# Patient Record
Sex: Male | Born: 1980 | Race: White | Hispanic: No | Marital: Single | Smoking: Current every day smoker
Health system: Southern US, Community
[De-identification: ages and names within clinical notes are randomized; demographics above are authoritative.]

---

## 2011-11-20 ENCOUNTER — Encounter (HOSPITAL_COMMUNITY): Payer: Self-pay | Admitting: Emergency Medicine

## 2011-11-20 ENCOUNTER — Emergency Department (HOSPITAL_COMMUNITY)
Admission: EM | Admit: 2011-11-20 | Discharge: 2011-11-20 | Disposition: A | Discharge status: Home / Self Care | Payer: Self-pay | Attending: Emergency Medicine | Admitting: Emergency Medicine

## 2011-11-20 DIAGNOSIS — J020 Streptococcal pharyngitis: Secondary | ICD-10-CM | POA: Insufficient documentation

## 2011-11-20 DIAGNOSIS — R509 Fever, unspecified: Secondary | ICD-10-CM | POA: Insufficient documentation

## 2011-11-20 LAB — RAPID STREP SCREEN (MED CTR MEBANE ONLY): Streptococcus, Group A Screen (Direct): POSITIVE — AB

## 2011-11-20 MED ORDER — CLINDAMYCIN HCL 150 MG PO CAPS: ORAL_CAPSULE | ORAL | Status: AC

## 2011-11-20 NOTE — ED Notes (Signed)
Pt states sore throat since Sunday with fever yesterday. White exudate noted.

## 2011-11-20 NOTE — ED Provider Notes (Signed)
History     CSN: [id number]  Arrival date & time 11/20/11  1002   First MD Initiated Contact with Patient 11/20/11 1015      Chief Complaint  Patient presents with  . Sore Throat    HPI Pt was seen at 1025.  Per pt, c/o gradual onset and persistence of constant sore throat for the past 3 days.  Has been associated with home fevers to "102."  Denies SOB/cough, no sneezing, no sinus congestion, no rash.     History reviewed. No pertinent past medical history.  History reviewed. No pertinent past surgical history.    History  Substance Use Topics  . Smoking status: Current Everyday Smoker    Types: Cigarettes  . Smokeless tobacco: Not on file  . Alcohol Use: No    Review of Systems ROS: Statement: All systems negative except as marked or noted in the HPI; Constitutional: Positive for fever and chills. ; ; Eyes: Negative for eye pain, redness and discharge. ; ; ENMT: Negative for ear pain, hoarseness, nasal congestion, sinus pressure and +sore throat. ; ; Cardiovascular: Negative for chest pain, palpitations, diaphoresis, dyspnea and peripheral edema. ; ; Respiratory: Negative for cough, wheezing and stridor. ; ; Gastrointestinal: Negative for nausea, vomiting, diarrhea, abdominal pain, blood in stool, hematemesis, jaundice and rectal bleeding. . ; ; Genitourinary: Negative for dysuria, flank pain and hematuria. ; ; Musculoskeletal: Negative for back pain and neck pain. Negative for swelling and trauma.; ; Skin: Negative for pruritus, rash, abrasions, blisters, bruising and skin lesion.; ; Neuro: Negative for headache, lightheadedness and neck stiffness. Negative for weakness, altered level of consciousness , altered mental status, extremity weakness, paresthesias, involuntary movement, seizure and syncope.      Allergies  Penicillins  Home Medications  No current outpatient prescriptions on file.  BP 129/81  Pulse 85  Temp(Src) 99 F (37.2 C) (Oral)  Resp 17  Ht 5\' 11"   (1.803 m)  Wt 205 lb (92.987 kg)  BMI 28.59 kg/m2  SpO2 94%  Physical Exam 1030: Physical examination:  Nursing notes reviewed; Vital signs and O2 SAT reviewed;  Constitutional: Well developed, Well nourished, Well hydrated, In no acute distress; Head:  Normocephalic, atraumatic; Eyes: EOMI, PERRL, No scleral icterus; ENMT: TM's clear bilat, +post pharynx erythemetous with bilat tonsilar exudates, no soft-palate bulging, no intra-oral edema, no hoarse voice, no drooling, no stridor.  Mouth normal, Mucous membranes moist; Neck: Supple, Full range of motion, No lymphadenopathy; Cardiovascular: Regular rate and rhythm, No murmur, rub, or gallop; Respiratory: Breath sounds clear & equal bilaterally, No rales, rhonchi, wheezes, or rub, Speaking full sentences with ease.  Normal respiratory effort/excursion; Chest: Nontender, Movement normal;; Extremities: Pulses normal, No tenderness, No edema, No calf edema or asymmetry.; Neuro: AA&Ox3, Major CN grossly intact.  No gross focal motor or sensory deficits in extremities.; Skin: Color normal, Warm, Dry, no rash.    ED Course  Procedures  MDM  MDM Reviewed: nursing note and vitals Interpretation: labs     Results for orders placed during the hospital encounter of 11/20/11  RAPID STREP SCREEN      Component Value Range   Streptococcus, Group A Screen (Direct) POSITIVE (*) NEGATIVE      11:27 AM:  Allergic to PCN, will tx with clindamycin.  Dx testing d/w pt.  Questions answered.  Verb understanding, agreeable to d/c home with outpt f/u.         No midlevel was involved in the care of this patient. [facility] M  Laray Anger, DO 11/22/11 2217

## 2011-11-20 NOTE — ED Notes (Signed)
Pt c/o sore throat since Sunday.  

## 2011-11-20 NOTE — Discharge Instructions (Signed)
RESOURCE GUIDE  Dental Problems  Patients with Medicaid: [facility]                     [facility] [address]                                           [address] Phone:  [phone]                                                  Phone:  [phone]  If unable to pay or uninsured, contact:  [facility] or [facility] to become qualified for the adult dental clinic.  Chronic Pain Problems Contact [facility]  [phone] Patients need to be referred by their primary care doctor.  Insufficient Money for Medicine Contact [employer]:  call "211" or [facility] 271-5999.  No Primary Care Doctor Call [facility]  [phone] Other agencies that provide inexpensive medical care    [facility]  [phone]    [facility]  [phone]    [facility]  [phone]    Women[facility] Clinic  [phone]    [facility]  [phone]    [facility]  [phone]  Psychological Services [facility]  [phone] [facility]  [phone] [facility]   [phone] (emergency services [phone])  Substance Abuse Resources [facility]  336-882-2125 [facility] 336-784-9470 [facility] 336-285-9073 [facility] 336-845-3988 Residential & Outpatient Substance Abuse Program  800-659-3381  Abuse/Neglect [place] Child Abuse Hotline (336) 641-3795 [place] Child Abuse Hotline 800-378-5315 (After Hours)  Emergency Shelter [facility] (336) 271-5985  Maternity Homes [facility] (336) 275-9566 [facility] (704) 372-4663  MRSA Hotline #:   832-7006    [place] [facility]  [facility] [place] [facility]     [employer]                          [facility] [address] [place]                       [address]      [address]  [place]                                                [place]                            [place] Phone:  [phone]                                   Phone:  [phone]                 Phone:  [phone]  [facility] Phone:  [phone]    [facility] Child Abuse Hotline 7051655903 954-721-9316 (After Hours)    Take over the counter tylenol and ibuprofen, as directed on packaging, as needed for discomfort.  Gargle with warm water several times per day to help with discomfort.  May also use over the counter sore throat pain medicines such as chloraseptic or sucrets, as directed on packaging, as needed for discomfort.  Call your regular medical doctor today to schedule a follow up appointment within the next week.  Return to the Emergency Department immediately if worsening.

## 2016-12-10 ENCOUNTER — Emergency Department (HOSPITAL_COMMUNITY)
Admission: EM | Admit: 2016-12-10 | Discharge: 2016-12-10 | Disposition: A | Discharge status: Home / Self Care | Payer: Self-pay | Attending: Emergency Medicine | Admitting: Emergency Medicine

## 2016-12-10 ENCOUNTER — Encounter (HOSPITAL_COMMUNITY): Payer: Self-pay | Admitting: Emergency Medicine

## 2016-12-10 ENCOUNTER — Emergency Department (HOSPITAL_COMMUNITY): Payer: Self-pay

## 2016-12-10 ENCOUNTER — Other Ambulatory Visit: Payer: Self-pay

## 2016-12-10 DIAGNOSIS — R079 Chest pain, unspecified: Secondary | ICD-10-CM

## 2016-12-10 DIAGNOSIS — F17210 Nicotine dependence, cigarettes, uncomplicated: Secondary | ICD-10-CM | POA: Insufficient documentation

## 2016-12-10 DIAGNOSIS — R091 Pleurisy: Secondary | ICD-10-CM | POA: Insufficient documentation

## 2016-12-10 LAB — COMPREHENSIVE METABOLIC PANEL
ALK PHOS: 76 U/L (ref 38–126)
ALT: 35 U/L (ref 17–63)
AST: 26 U/L (ref 15–41)
Albumin: 4 g/dL (ref 3.5–5.0)
Anion gap: 7 (ref 5–15)
BUN: 10 mg/dL (ref 6–20)
CALCIUM: 9.3 mg/dL (ref 8.9–10.3)
CHLORIDE: 103 mmol/L (ref 101–111)
CO2: 28 mmol/L (ref 22–32)
CREATININE: 1.13 mg/dL (ref 0.61–1.24)
Glucose, Bld: 156 mg/dL — ABNORMAL HIGH (ref 65–99)
Potassium: 4.2 mmol/L (ref 3.5–5.1)
Sodium: 138 mmol/L (ref 135–145)
Total Bilirubin: 0.5 mg/dL (ref 0.3–1.2)
Total Protein: 7.6 g/dL (ref 6.5–8.1)

## 2016-12-10 LAB — CBC WITH DIFFERENTIAL/PLATELET
Basophils Absolute: 0 10*3/uL (ref 0.0–0.1)
Basophils Relative: 0 %
Eosinophils Absolute: 0.3 10*3/uL (ref 0.0–0.7)
Eosinophils Relative: 4 %
HCT: 51.3 % (ref 39.0–52.0)
Hemoglobin: 17.3 g/dL — ABNORMAL HIGH (ref 13.0–17.0)
Lymphocytes Relative: 26 %
Lymphs Abs: 2 10*3/uL (ref 0.7–4.0)
MCH: 30.6 pg (ref 26.0–34.0)
MCHC: 33.7 g/dL (ref 30.0–36.0)
MCV: 90.8 fL (ref 78.0–100.0)
Monocytes Absolute: 0.5 10*3/uL (ref 0.1–1.0)
Monocytes Relative: 6 %
Neutro Abs: 5 10*3/uL (ref 1.7–7.7)
Neutrophils Relative %: 64 %
Platelets: 241 10*3/uL (ref 150–400)
RBC: 5.65 MIL/uL (ref 4.22–5.81)
RDW: 12.2 % (ref 11.5–15.5)
WBC: 7.8 10*3/uL (ref 4.0–10.5)

## 2016-12-10 LAB — TROPONIN I: Troponin I: <0.03 ng/mL

## 2016-12-10 MED ORDER — IOPAMIDOL (ISOVUE-300) INJECTION 61%: 100.0000 mL | Freq: Once | INTRAVENOUS | Status: DC | PRN

## 2016-12-10 MED ORDER — KETOROLAC TROMETHAMINE 30 MG/ML IJ SOLN
30.0000 mg | Freq: Once | INTRAMUSCULAR | Status: AC
  Administered 2016-12-10: 30 mg via INTRAVENOUS
  Filled 2016-12-10: qty 1

## 2016-12-10 MED ORDER — IBUPROFEN 800 MG PO TABS: 800.0000 mg | ORAL_TABLET | Freq: Three times a day (TID) | ORAL | 0 refills | Status: AC

## 2016-12-10 MED ORDER — HYDROCODONE-ACETAMINOPHEN 5-325 MG PO TABS
2.0000 | ORAL_TABLET | Freq: Once | ORAL | Status: AC
  Administered 2016-12-10: 2 via ORAL
  Filled 2016-12-10: qty 2

## 2016-12-10 MED ORDER — IOPAMIDOL (ISOVUE-370) INJECTION 76%
100.0000 mL | Freq: Once | INTRAVENOUS | Status: AC | PRN
  Administered 2016-12-10: 100 mL via INTRAVENOUS

## 2016-12-10 NOTE — ED Notes (Signed)
Patient transported to X-ray 

## 2016-12-10 NOTE — ED Provider Notes (Signed)
[facility] DEPT Provider Note   CSN: [id number] Arrival date & time: 12/10/16  1743     History   Chief Complaint Chief Complaint  Patient presents with  . Chest Pain    HPI Anthony Zuniga is a 36 y.o. male.  HPI  The patient is a 36 year old male with no significant past medical history other than hypertension for which he no longer takes medications.  He states that he had a recent physical exam a couple of weeks ago when his blood pressure was 130/80, this was after not taking medications for some time. He was in his usual state of health until 4 days ago when he developed left-sided chest and left upper back pain. This is sharp and stabbing, severe, seems to be worse with breathing.  He denies any swelling of his legs, denies any recent travel, trauma, immobilization, surgery. He denies any prior history of DVT or pulmonary embolism. He also denies any history of chest pain, cardiac disease or prior lung disease. He does smoke cigarettes. This pain does seem to get worse with deep breathing as well as movement and change of position but does not get worse with palpation over the chest wall.  History reviewed. No pertinent past medical history.  There are no active problems to display for this patient.   History reviewed. No pertinent surgical history.     Home Medications    Prior to Admission medications   Medication Sig Start Date End Date Taking? Authorizing Provider  ibuprofen (ADVIL,MOTRIN) 800 MG tablet Take 1 tablet (800 mg total) by mouth 3 (three) times daily. 12/10/16   Eber Hong, MD    Family History History reviewed. No pertinent family history.  Social History Social History  Substance Use Topics  . Smoking status: Current Every Day Smoker    Packs/day: 1.00    Types: Cigarettes  . Smokeless tobacco: Never Used  . Alcohol use No     Allergies   Penicillins   Review of Systems Review of Systems  All other systems reviewed and are  negative.    Physical Exam Updated Vital Signs BP (!) 168/97   Pulse (!) 104   Temp 98.4 F (36.9 C) (Oral)   Resp (!) 23   Wt 220 lb (99.8 kg)   SpO2 100%   BMI 30.68 kg/m   Physical Exam  Constitutional: He appears well-developed and well-nourished. No distress.  Uncomfortable appearing  HENT:  Head: Normocephalic and atraumatic.  Mouth/Throat: Oropharynx is clear and moist. No oropharyngeal exudate.  Eyes: Conjunctivae and EOM are normal. Pupils are equal, round, and reactive to light. Right eye exhibits no discharge. Left eye exhibits no discharge. No scleral icterus.  Neck: Normal range of motion. Neck supple. No JVD present. No thyromegaly present.  Cardiovascular: Regular rhythm, normal heart sounds and intact distal pulses.  Exam reveals no gallop and no friction rub.   No murmur heard. Mild tachycardia  Pulmonary/Chest: Effort normal. No respiratory distress. He has wheezes. He has no rales.  Abdominal: Soft. Bowel sounds are normal. He exhibits no distension and no mass. There is no tenderness.  Musculoskeletal: Normal range of motion. He exhibits no edema or tenderness.  No peripheral edema  Lymphadenopathy:    He has no cervical adenopathy.  Neurological: He is alert. Coordination normal.  Skin: Skin is warm and dry. No rash noted. No erythema.  Psychiatric: He has a normal mood and affect. His behavior is normal.  Nursing note and vitals reviewed.  ED Treatments / Results  Labs (all labs ordered are listed, but only abnormal results are displayed) Labs Reviewed  CBC WITH DIFFERENTIAL/PLATELET - Abnormal; Notable for the following:       Result Value   Hemoglobin 17.3 (*)    All other components within normal limits  COMPREHENSIVE METABOLIC PANEL - Abnormal; Notable for the following:    Glucose, Bld 156 (*)    All other components within normal limits  TROPONIN I   ED ECG REPORT  I personally interpreted this EKG   Date: 12/10/2016   Rate: 100   Rhythm: sinus tachycardia  QRS Axis: right  Intervals: normal  ST/T Wave abnormalities: nonspecific ST/T changes  Conduction Disutrbances:none  Narrative Interpretation:   Old EKG Reviewed: none available   Radiology Dg Chest 2 View  Result Date: 12/10/2016 CLINICAL DATA:  Chest pain for several days EXAM: CHEST  2 VIEW COMPARISON:  None. FINDINGS: The heart size and mediastinal contours are within normal limits. Both lungs are clear. The visualized skeletal structures are unremarkable. IMPRESSION: No active cardiopulmonary disease. Electronically Signed   By: Jasmine Pang M.D.   On: 12/10/2016 18:39   Ct Angio Chest Pe W Or Wo Contrast  Result Date: 12/10/2016 CLINICAL DATA:  Chest pain starting 3-4 days ago EXAM: CT ANGIOGRAPHY CHEST WITH CONTRAST TECHNIQUE: Multidetector CT imaging of the chest was performed using the standard protocol during bolus administration of intravenous contrast. Multiplanar CT image reconstructions and MIPs were obtained to evaluate the vascular anatomy. CONTRAST:  100 cc Isovue 370 IV COMPARISON:  CXR 12/10/2016 FINDINGS: Cardiovascular: The study is of quality for the evaluation of pulmonary embolism. There are no filling defects in the central, lobar, segmental or subsegmental pulmonary artery branches to suggest acute pulmonary embolism. Great vessels are normal in course and caliber. Normal heart size. No significant pericardial fluid/thickening. No thoracic aortic aneurysm nor dissection. Mediastinum/Nodes: No discrete thyroid nodules. Unremarkable esophagus. No pathologically enlarged axillary, mediastinal or hilar lymph nodes. Lungs/Pleura: No pneumothorax. No pleural effusion. No dominant mass or pulmonary consolidations. Ground-glass opacities along the dependent aspect of both lower lobes likely represent changes of atelectasis. Upper abdomen: A 4 mm hypodensity in the right hepatic dome, series 4, image 71 is too small to further characterize but more  commonly is associated with a cyst or hemangioma. Musculoskeletal:  No aggressive appearing focal osseous lesions. Review of the MIP images confirms the above findings. IMPRESSION: 1. No acute pulmonary embolism, aortic aneurysm or dissection. 2. No acute pulmonary abnormality.  Dependent atelectasis noted. 3. Tiny 4 mm hypodensity in the right hepatic dome more commonly associated with a cyst or hemangioma but is too small to further characterize. Electronically Signed   By: Tollie Eth M.D.   On: 12/10/2016 19:23    Procedures Procedures (including critical care time)  Medications Ordered in ED Medications  iopamidol (ISOVUE-300) 61 % injection 100 mL (not administered)  HYDROcodone-acetaminophen (NORCO/VICODIN) 5-325 MG per tablet 2 tablet (not administered)  ketorolac (TORADOL) 30 MG/ML injection 30 mg (30 mg Intravenous Given 12/10/16 1818)  iopamidol (ISOVUE-370) 76 % injection 100 mL (100 mLs Intravenous Contrast Given 12/10/16 1901)     Initial Impression / Assessment and Plan / ED Course  I have reviewed the triage vital signs and the nursing notes.  Pertinent labs & imaging results that were available during my care of the patient were reviewed by me and considered in my medical decision making (see chart for details).   The patient has a  markedly abnormal EKG with right axis deviation, sinus tachycardia and some nonspecific ST and T waves. By history this appears to be more of a pleuritic type pain suggestive of possible underlying pulmonary embolism, lung pathology and less likely to be acutely cardiac. We'll obtain labs including a troponin, chest x-ray, the patient will likely need a CT angiogram of no alternative etiology is found. While there is a slight wheeze on exam it is not surprising in this patient was a chronic smoker.  Hydrocodone and toradol given Labs and CT results reviewed - no PE or other serious pathology Pt informed of all results, given copy of results. Can  f/u outpatient Pt agreeable.  Final Clinical Impressions(s) / ED Diagnoses   Final diagnoses:  Chest pain  Pleurisy    New Prescriptions New Prescriptions   IBUPROFEN (ADVIL,MOTRIN) 800 MG TABLET    Take 1 tablet (800 mg total) by mouth 3 (three) times daily.     Eber HongMiller, Jackolyn Geron, MD 12/10/16 [phone]

## 2016-12-10 NOTE — ED Triage Notes (Signed)
CP started 3-4 days ago with radiation to back, describes as a stabbing pain worse with inhalation. Cannot replicate pain with palpation, denies injury.

## 2016-12-10 NOTE — Discharge Instructions (Signed)
Your testing shows no signs of blood clot or lung or heart abnormality -you likely have pleurisy - see the attachments for further information on this - it should get better over next 10 days Motrin 3 times a day Avoid exertion / deep breathing ER if you have worsening symptoms.

## 2018-04-06 IMAGING — CT CT ANGIO CHEST
2 of 6 series · 18 of 46 positions shown · IV contrast (Isovue)
Comparison: CXR 12/10/2016

CLINICAL DATA: Chest pain starting 3-4 days ago

EXAM:
CT ANGIOGRAPHY CHEST WITH CONTRAST
TECHNIQUE: Multidetector CT imaging of the chest was performed using the
standard protocol during bolus administration of intravenous
contrast. Multiplanar CT image reconstructions and MIPs were
obtained to evaluate the vascular anatomy.
CONTRAST:  100 cc Isovue 370 IV

[Series 5: thins · axial · 0.76mm/px · z∈[+1269,+1538]mm · 15 of 295 slices shown]
[im 13/295  lung]
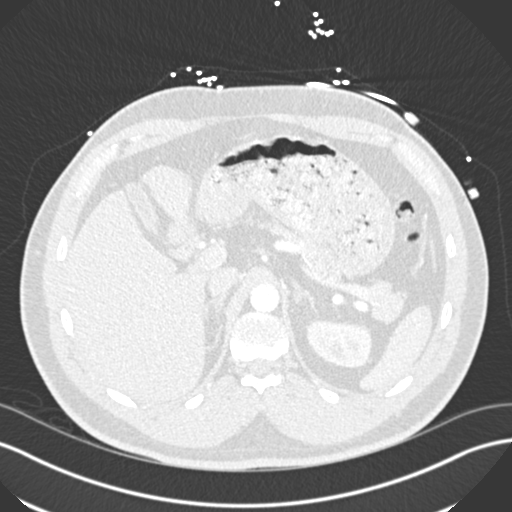
[im 39/295  soft-tissue]
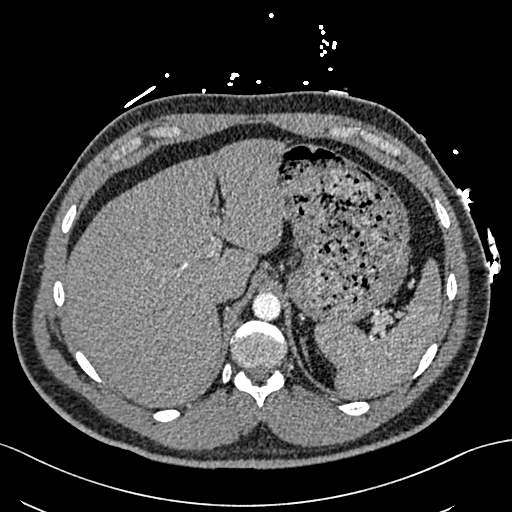
[im 52/295  lung]
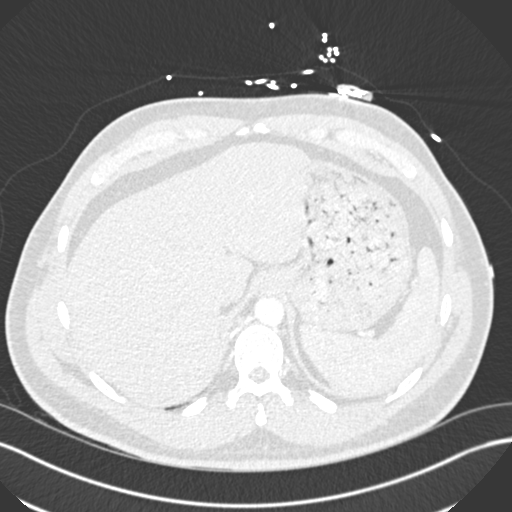
[im 77/295  soft-tissue]
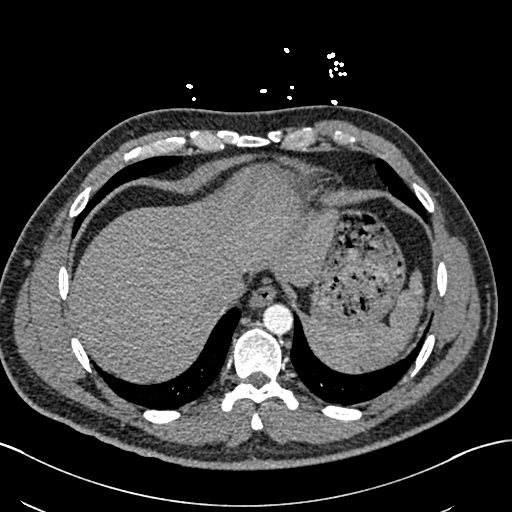
[im 90/295  lung]
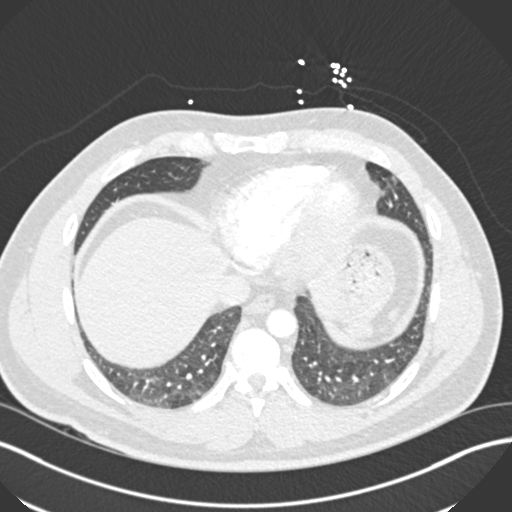
[im 116/295  soft-tissue]
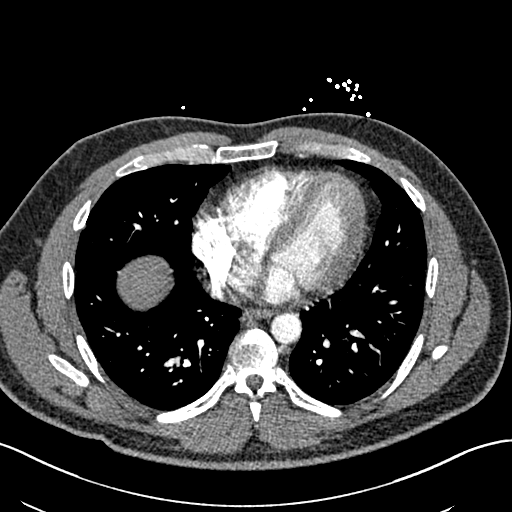
[im 128/295  lung]
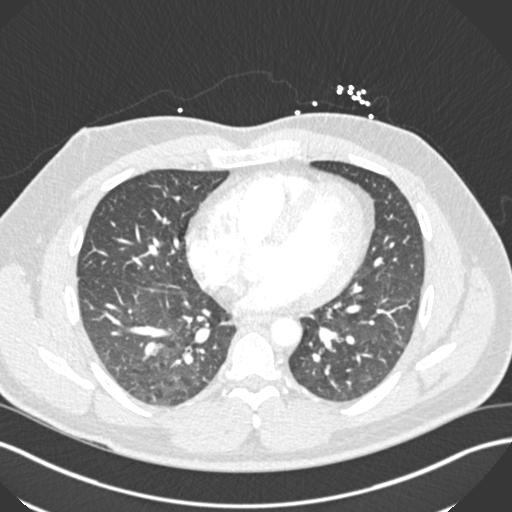
[im 154/295  soft-tissue]
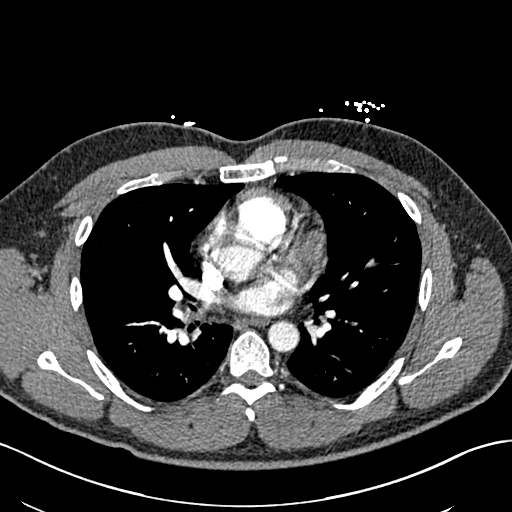
[im 167/295  lung]
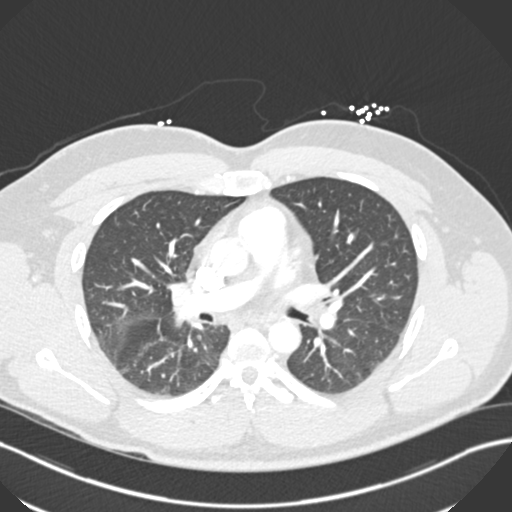
[im 179/295  soft-tissue]
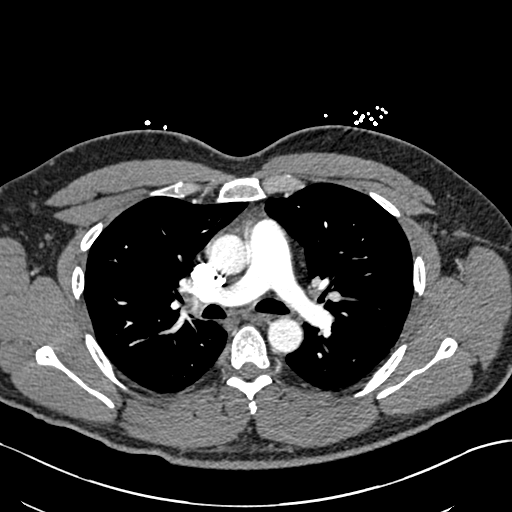
[im 205/295  lung]
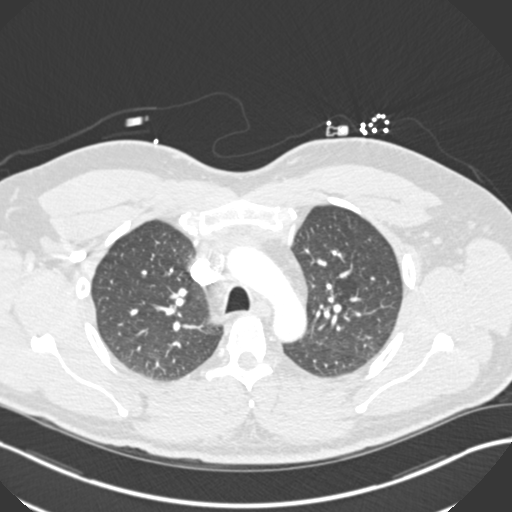
[im 218/295  soft-tissue]
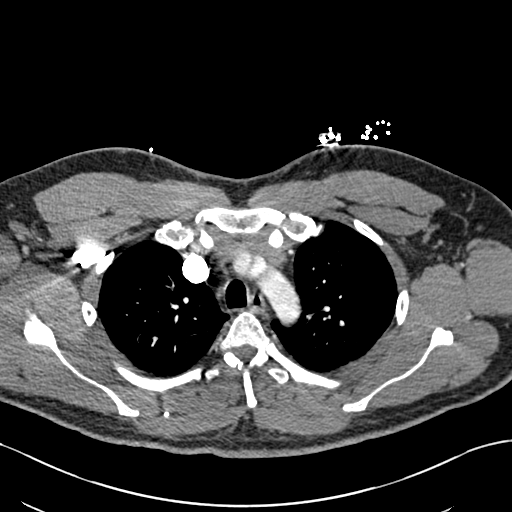
[im 243/295  lung]
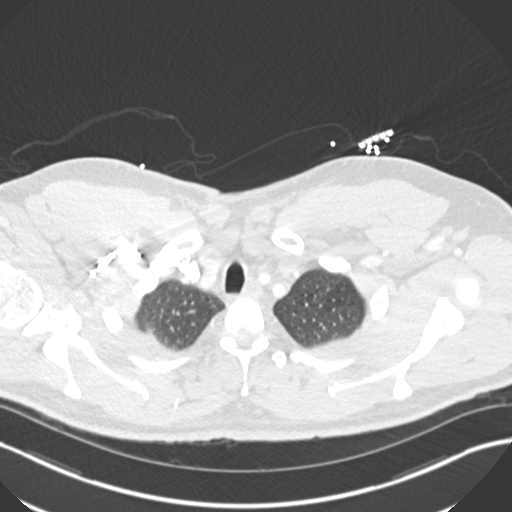
[im 256/295  soft-tissue]
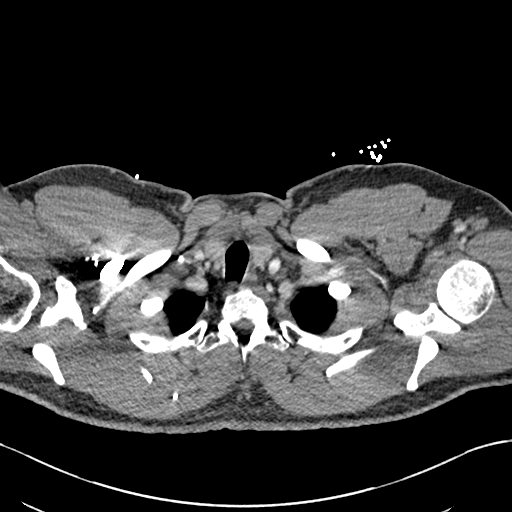
[im 282/295  lung]
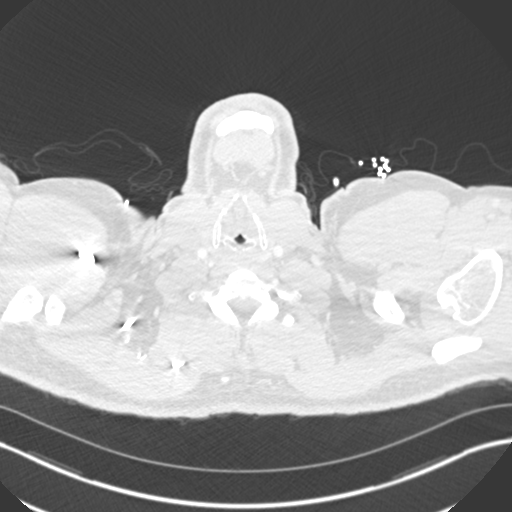

[Series 7: coronal mpr · coronal · 0.62mm/px · 3 of 141 slices shown]
[im 36/141  soft-tissue]
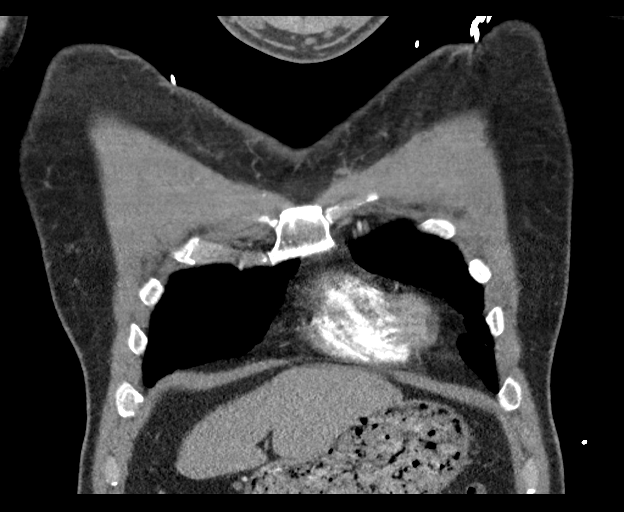
[im 71/141  soft-tissue]
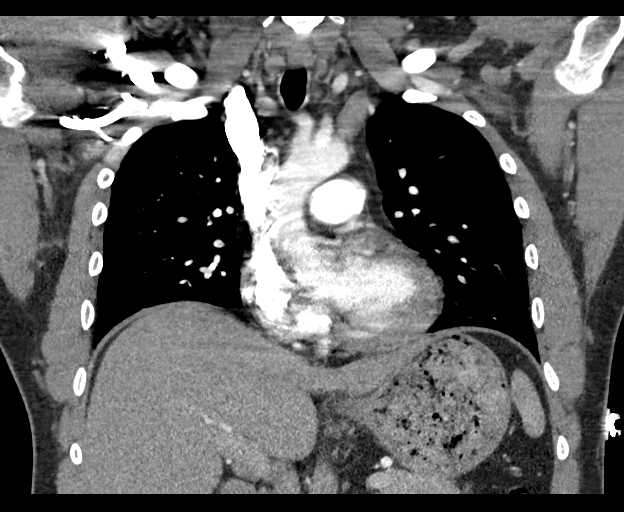
[im 106/141  soft-tissue]
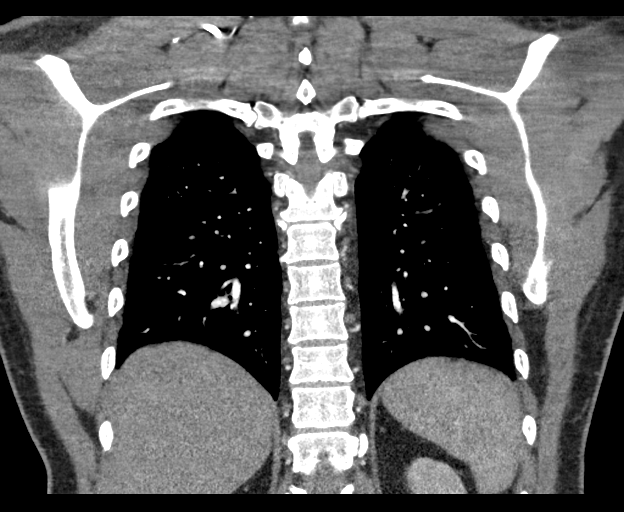

[18 of 46 positions shown; findings below may reference images not displayed]

FINDINGS: Cardiovascular: The study is of quality for the evaluation of
pulmonary embolism. There are no filling defects in the central,
lobar, segmental or subsegmental pulmonary artery branches to
suggest acute pulmonary embolism. Great vessels are normal in course
and caliber. Normal heart size. No significant pericardial
fluid/thickening. No thoracic aortic aneurysm nor dissection.

Mediastinum/Nodes: No discrete thyroid nodules. Unremarkable
esophagus. No pathologically enlarged axillary, mediastinal or hilar
lymph nodes.

Lungs/Pleura: No pneumothorax. No pleural effusion. No dominant mass
or pulmonary consolidations. Ground-glass opacities along the
dependent aspect of both lower lobes likely represent changes of
atelectasis.

Upper abdomen: A 4 mm hypodensity in the right hepatic dome, series
4, image 71 is too small to further characterize but more commonly
is associated with a cyst or hemangioma.

Musculoskeletal:  No aggressive appearing focal osseous lesions.

Review of the MIP images confirms the above findings.
IMPRESSION: 1. No acute pulmonary embolism, aortic aneurysm or dissection.
2. No acute pulmonary abnormality.  Dependent atelectasis noted.
3. Tiny 4 mm hypodensity in the right hepatic dome more commonly
associated with a cyst or hemangioma but is too small to further
characterize.

## 2024-03-30 ENCOUNTER — Other Ambulatory Visit (INDEPENDENT_AMBULATORY_CARE_PROVIDER_SITE_OTHER)

## 2024-03-30 ENCOUNTER — Ambulatory Visit (INDEPENDENT_AMBULATORY_CARE_PROVIDER_SITE_OTHER): Admitting: Physician Assistant

## 2024-03-30 DIAGNOSIS — M722 Plantar fascial fibromatosis: Secondary | ICD-10-CM

## 2024-03-30 NOTE — Progress Notes (Signed)
 Office Visit Note   Patient: Anthony Zuniga           Date of Birth: 07-Jan-1981           MRN: [id number] Visit Date: 03/30/2024              Requested by: Corlis Pagan, NP [address] [place],  [place] [address] PCP: Patient, No Pcp Per   Assessment & Plan: Visit Diagnoses:  1. Plantar fasciitis of right foot     Plan: Impression is right foot plantar fasciitis with symptoms also somewhat consistent with posterior tibial tendinitis.  Discussed trying a cam boot but patient does not think he would wear this.  We have discussed obtaining full foot orthotics, supportive shoe wear and gastroc stretching for which she is agreeable to.  We have also discussed plantar fascia injection for which she will hold off on for now.  He will follow-up with us  as needed.  Call with concerns or questions.  Follow-Up Instructions: Return if symptoms worsen or fail to improve.   Orders:  Orders Placed This Encounter  Procedures   XR Foot Complete Right   XR Ankle Complete Right   No orders of the defined types were placed in this encounter.     Procedures: No procedures performed   Clinical Data: No additional findings.   Subjective: Chief Complaint  Patient presents with   Right Foot - Pain   Right Ankle - Pain    HPI patient is a pleasant 43 year old gentleman who comes in today with right foot pain.  Symptoms began a few months ago and have progressively worsened.  He denies any injury or change in activity.  Pain is primarily located at the medial heel and into the medial ankle.  He notes his pain is worse first thing in the morning after getting out of bed and last for about an hour.  Symptoms improved but then towards the end of the day his symptoms returned after being on his feet.  He has tried Tylenol  and Advil  without relief.  No previous plantar fascia injection.  He does not wear orthotics and has not tried any gastroc stretching.  Review of Systems as  detailed in HPI.  All others reviewed and are negative.   Objective: Vital Signs: There were no vitals taken for this visit.  Physical Exam well-developed well-nourished gentleman in no acute distress.  Alert and oriented x 3.  Ortho Exam right foot exam: He has marked tenderness along the plantar fascia insertion site of the heel.  He also has moderate tenderness along the posterior tibial tendon with associated ecchymosis.  Painless range of motion in all directions.  He is neurovascularly intact distally.  Specialty Comments:  No specialty comments available.  Imaging: XR Ankle Complete Right Result Date: 03/30/2024 X-rays demonstrate diffuse degenerative changes throughout the right foot and ankle.  There is osteophyte formation to the plantar fascia attachment site at the heel.  No acute fracture noted.  XR Foot Complete Right Result Date: 03/30/2024 X-rays demonstrate diffuse degenerative changes throughout the right foot and ankle.  There is osteophyte formation to the plantar fascia attachment site at the heel.  No acute fracture noted.    PMFS History: There are no active problems to display for this patient.  No past medical history on file.  No family history on file.  No past surgical history on file. Social History   Occupational History   Not on file  Tobacco  Use   Smoking status: Every Day    Current packs/day: 1.00    Types: Cigarettes   Smokeless tobacco: Never  Substance and Sexual Activity   Alcohol use: No   Drug use: No   Sexual activity: Not on file

## 2024-05-31 ENCOUNTER — Encounter: Payer: Self-pay | Admitting: Radiology
